# Patient Record
Sex: Male | Born: 1970
Health system: Southern US, Community
[De-identification: ages and names within clinical notes are randomized; demographics above are authoritative.]

## PROBLEM LIST (undated history)

## (undated) DIAGNOSIS — J45909 Unspecified asthma, uncomplicated: Secondary | ICD-10-CM

## (undated) DIAGNOSIS — E785 Hyperlipidemia, unspecified: Secondary | ICD-10-CM

## (undated) HISTORY — DX: Hyperlipidemia, unspecified: E78.5

## (undated) HISTORY — DX: Unspecified asthma, uncomplicated: J45.909

---

## 2005-08-29 ENCOUNTER — Encounter: Admission: RE | Admit: 2005-08-29 | Discharge: 2005-08-29 | Payer: Self-pay | Admitting: Occupational Medicine

## 2013-06-29 ENCOUNTER — Ambulatory Visit: Payer: Self-pay

## 2013-06-29 ENCOUNTER — Ambulatory Visit: Payer: Self-pay | Admitting: Family Medicine

## 2013-06-29 VITALS — BP 98/62 | HR 91 | Temp 98.5°F | Resp 18 | Ht 71.0 in | Wt 172.0 lb

## 2013-06-29 DIAGNOSIS — R05 Cough: Secondary | ICD-10-CM

## 2013-06-29 DIAGNOSIS — R059 Cough, unspecified: Secondary | ICD-10-CM

## 2013-06-29 DIAGNOSIS — R918 Other nonspecific abnormal finding of lung field: Secondary | ICD-10-CM

## 2013-06-29 MED ORDER — PREDNISONE 20 MG PO TABS: ORAL_TABLET | ORAL | Status: DC

## 2013-06-29 MED ORDER — AZITHROMYCIN 250 MG PO TABS: ORAL_TABLET | ORAL | Status: DC

## 2013-06-29 NOTE — Progress Notes (Signed)
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  He has right lower lateral rib pains which began spontaneously 1 month ago.  The pain resolved for 2 weeks but has come back.  No significant activities or trauma hx.  Objective: Well-developed, muscular athletic-appearing young man. HEENT: Unremarkable Chest: Sonorous rhonchi on right, otherwise clear; nontender Heart: Regular no murmur Abdomen: Soft nontender without [facility] reading (PRIMARY) by  Dr. Milus Glazier  CXR  Right perihilar infiltrate  Assessment: Cough - Plan: DG Chest 2 View, azithromycin (ZITHROMAX Z-PAK) 250 MG tablet, predniSONE (DELTASONE) 20 MG tablet  Pulmonary infiltrate - Plan: azithromycin (ZITHROMAX Z-PAK) 250 MG tablet, predniSONE (DELTASONE) 20 MG tablet  Signed, Elvina Sidle, MD  .

## 2014-12-18 ENCOUNTER — Ambulatory Visit: Payer: Self-pay | Admitting: Family Medicine

## 2015-03-12 ENCOUNTER — Ambulatory Visit: Payer: Self-pay | Admitting: Family Medicine

## 2015-03-16 ENCOUNTER — Ambulatory Visit (INDEPENDENT_AMBULATORY_CARE_PROVIDER_SITE_OTHER): Payer: Self-pay | Admitting: Family Medicine

## 2015-03-16 ENCOUNTER — Encounter: Payer: Self-pay | Admitting: Family Medicine

## 2015-03-16 VITALS — BP 122/80 | HR 95 | Temp 98.2°F | Resp 16 | Wt 177.0 lb

## 2015-03-16 DIAGNOSIS — R0982 Postnasal drip: Secondary | ICD-10-CM | POA: Insufficient documentation

## 2015-03-16 DIAGNOSIS — K219 Gastro-esophageal reflux disease without esophagitis: Secondary | ICD-10-CM

## 2015-03-16 NOTE — Progress Notes (Signed)
   Subjective:    Patient ID: Carlos Wade, male    DOB: 04/02/1971, 44 y.o.   MRN: [id number]  HPI Cough- 'i have this lump in my throat'.  Noticed ~1 yr ago.  Recently has been 'coughing up phlegm', having to clear his throat, worse at night.  Pt reports feeling a lump on throat.  pt reports intermittent heartburn. + PND.  No night sweats/fevers.  No unexplained weight loss.  + tobacco use.  No SOB.   Review of Systems For ROS see HPI     Objective:   Physical Exam  Constitutional: He appears well-developed and well-nourished. No distress.  HENT:  Head: Normocephalic and atraumatic.  No TTP over sinuses + turbinate edema + PND TMs normal bilaterally  Eyes: Conjunctivae and EOM are normal. Pupils are equal, round, and reactive to light.  Neck: Normal range of motion. Neck supple. No tracheal deviation present. No thyromegaly present.  Cardiovascular: Normal rate, regular rhythm and normal heart sounds.   Pulmonary/Chest: Effort normal and breath sounds normal. No respiratory distress. He has no wheezes.  Abdominal: Soft. Bowel sounds are normal. He exhibits no distension. There is no tenderness. There is no rebound.  Lymphadenopathy:    He has no cervical adenopathy.  Skin: Skin is warm and dry.  Psychiatric: He has a normal mood and affect. His behavior is normal. Thought content normal.  Vitals reviewed.         Assessment & Plan:

## 2015-03-16 NOTE — Patient Instructions (Signed)
Follow up as scheduled- sooner if needed Start daily Zyrtec (store brand generic) for the seasonal allergies/post-nasal drip Start daily Omeprazole 20mg  for the acid reflux component Try and avoid eating 2 hrs prior to bed Prop up your head on pillows Drink plenty of fluids Limit smoking, caffeine, and alcohol- all can worsen reflux Call with any questions or concerns Welcome!  We're glad to have you! Hang in there!

## 2015-03-16 NOTE — Progress Notes (Signed)
Pre visit review using our clinic review tool, if applicable. No additional management support is needed unless otherwise documented below in the visit note. 

## 2015-03-18 NOTE — Assessment & Plan Note (Signed)
Pt also w/ PND on PE and this is likely contributing to cough, 'lump in throat' and needing to clear his throat regularly.  Start OTC antihistamine.  Reviewed supportive care and red flags that should prompt return.  Pt expressed understanding and is in agreement w/ plan.

## 2015-03-18 NOTE — Assessment & Plan Note (Signed)
New.  Pt's sxs are consistent w/ untreated GERD.  Start PPI to reduce nocturnal reflux.  Reviewed dietary and lifestyle modifications.  Will continue to follow.

## 2015-03-30 ENCOUNTER — Ambulatory Visit: Payer: Self-pay | Admitting: Family Medicine

## 2015-07-11 ENCOUNTER — Ambulatory Visit (INDEPENDENT_AMBULATORY_CARE_PROVIDER_SITE_OTHER): Payer: BLUE CROSS/BLUE SHIELD | Admitting: Family Medicine

## 2015-07-11 ENCOUNTER — Encounter: Payer: Self-pay | Admitting: Family Medicine

## 2015-07-11 VITALS — BP 112/78 | HR 96 | Temp 98.2°F | Resp 17 | Ht 71.0 in | Wt 170.0 lb

## 2015-07-11 DIAGNOSIS — Z0000 Encounter for general adult medical examination without abnormal findings: Secondary | ICD-10-CM | POA: Diagnosis not present

## 2015-07-11 MED ORDER — CETIRIZINE HCL 10 MG PO TABS: 10.0000 mg | ORAL_TABLET | Freq: Every day | ORAL | Status: DC

## 2015-07-11 MED ORDER — OMEPRAZOLE 20 MG PO CPDR: 20.0000 mg | DELAYED_RELEASE_CAPSULE | Freq: Every day | ORAL | Status: DC

## 2015-07-11 NOTE — Progress Notes (Signed)
   Subjective:    Patient ID: Carlos Wade, male    DOB: [birth year], 44 y.o.   MRN: [id number]  HPI CPE- no concerns today   Review of Systems Patient reports no vision/hearing changes, anorexia, fever ,adenopathy, persistant/recurrent hoarseness, swallowing issues, chest pain, palpitations, edema, hemoptysis, dyspnea (rest,exertional, paroxysmal nocturnal), gastrointestinal  bleeding (melena, rectal bleeding), abdominal pain, GU symptoms (dysuria, hematuria, voiding/incontinence issues) syncope, focal weakness, memory loss, numbness & tingling, skin/hair/nail changes, depression, anxiety, abnormal bruising/bleeding, musculoskeletal symptoms/signs.   + cough- improved while taking Zyrtec and PPI.  Returned after stopping meds. + GERD- not taking the Omeprazole as directed    Objective:   Physical Exam General Appearance:    Alert, cooperative, no distress, appears stated age  Head:    Normocephalic, without obvious abnormality, atraumatic  Eyes:    PERRL, conjunctiva/corneas clear, EOM's intact, fundi    benign, both eyes       Ears:    Normal TM's and external ear canals, both ears  Nose:   Nares normal, septum midline, mucosa normal, no drainage   or sinus tenderness  Throat:   Lips, mucosa, and tongue normal; teeth and gums normal  Neck:   Supple, symmetrical, trachea midline, no adenopathy;       thyroid:  No enlargement/tenderness/nodules  Back:     Symmetric, no curvature, ROM normal, no CVA tenderness  Lungs:     Clear to auscultation bilaterally, respirations unlabored  Chest wall:    No tenderness or deformity  Heart:    Regular rate and rhythm, S1 and S2 normal, no murmur, rub   or gallop  Abdomen:     Soft, non-tender, bowel sounds active all four quadrants,    no masses, no organomegaly  Genitalia:    Normal male without lesion, masses,discharge or tenderness  Rectal:    Deferred due to young age  Extremities:   Extremities normal, atraumatic, no cyanosis or edema    Pulses:   2+ and symmetric all extremities  Skin:   Skin color, texture, turgor normal, no rashes or lesions  Lymph nodes:   Cervical, supraclavicular, and axillary nodes normal  Neurologic:   CNII-XII intact. Normal strength, sensation and reflexes      throughout          Assessment & Plan:

## 2015-07-11 NOTE — Progress Notes (Signed)
Pre visit review using our clinic review tool, if applicable. No additional management support is needed unless otherwise documented below in the visit note. 

## 2015-07-11 NOTE — Assessment & Plan Note (Signed)
Pt's PE WNL.  Encouraged smoking cessation.  Check labs.  Anticipatory guidance provided.

## 2015-07-11 NOTE — Patient Instructions (Signed)
Follow up in 1 year or as needed We'll notify you of your lab results and make any changes if needed Start the Omeprazole daily for acid reflux Start Zyrtec daily for the post-nasal drip STOP SMOKING! Call with any questions or concerns Happy Labor Day!

## 2015-07-12 ENCOUNTER — Telehealth: Payer: Self-pay

## 2015-07-12 ENCOUNTER — Other Ambulatory Visit (INDEPENDENT_AMBULATORY_CARE_PROVIDER_SITE_OTHER): Payer: BLUE CROSS/BLUE SHIELD

## 2015-07-12 DIAGNOSIS — R739 Hyperglycemia, unspecified: Secondary | ICD-10-CM | POA: Diagnosis not present

## 2015-07-12 LAB — HEPATIC FUNCTION PANEL
ALT: 62 U/L — ABNORMAL HIGH (ref 0–53)
AST: 39 U/L — ABNORMAL HIGH (ref 0–37)
Albumin: 4.4 g/dL (ref 3.5–5.2)
Alkaline Phosphatase: 38 U/L — ABNORMAL LOW (ref 39–117)
Bilirubin, Direct: 0.1 mg/dL (ref 0.0–0.3)
Total Bilirubin: 0.4 mg/dL (ref 0.2–1.2)
Total Protein: 7.5 g/dL (ref 6.0–8.3)

## 2015-07-12 LAB — CBC WITH DIFFERENTIAL/PLATELET
Basophils Absolute: 0 10*3/uL (ref 0.0–0.1)
Basophils Relative: 0.6 % (ref 0.0–3.0)
Eosinophils Absolute: 0.1 10*3/uL (ref 0.0–0.7)
Eosinophils Relative: 2.6 % (ref 0.0–5.0)
HCT: 40.5 % (ref 39.0–52.0)
Hemoglobin: 13.3 g/dL (ref 13.0–17.0)
Lymphocytes Relative: 27 % (ref 12.0–46.0)
Lymphs Abs: 1.1 10*3/uL (ref 0.7–4.0)
MCHC: 32.8 g/dL (ref 30.0–36.0)
MCV: 96 fl (ref 78.0–100.0)
MONOS PCT: 7.6 % (ref 3.0–12.0)
Monocytes Absolute: 0.3 10*3/uL (ref 0.1–1.0)
Neutro Abs: 2.6 10*3/uL (ref 1.4–7.7)
Neutrophils Relative %: 62.2 % (ref 43.0–77.0)
Platelets: 218 10*3/uL (ref 150.0–400.0)
RBC: 4.22 Mil/uL (ref 4.22–5.81)
RDW: 14.6 % (ref 11.5–15.5)
WBC: 4.2 10*3/uL (ref 4.0–10.5)

## 2015-07-12 LAB — LIPID PANEL
Cholesterol: 207 mg/dL — ABNORMAL HIGH (ref 0–200)
HDL: 87.9 mg/dL (ref >39.00)
LDL Cholesterol: 107 mg/dL — ABNORMAL HIGH (ref 0–99)
NonHDL: 119.25
Total CHOL/HDL Ratio: 2
Triglycerides: 62 mg/dL (ref 0.0–149.0)
VLDL: 12.4 mg/dL (ref 0.0–40.0)

## 2015-07-12 LAB — BASIC METABOLIC PANEL
BUN: 8 mg/dL (ref 6–23)
CO2: 25 mEq/L (ref 19–32)
Calcium: 9.6 mg/dL (ref 8.4–10.5)
Chloride: 106 mEq/L (ref 96–112)
Creatinine, Ser: 1.06 mg/dL (ref 0.40–1.50)
GFR: 96.45 mL/min (ref >60.00)
GLUCOSE: 118 mg/dL — AB (ref 70–99)
Potassium: 3.9 mEq/L (ref 3.5–5.1)
Sodium: 143 mEq/L (ref 135–145)

## 2015-07-12 LAB — HEMOGLOBIN A1C: Hgb A1c MFr Bld: 5.3 % (ref 4.6–6.5)

## 2015-07-12 LAB — PSA: PSA: 1.57 ng/mL (ref 0.10–4.00)

## 2015-07-12 LAB — TSH: TSH: 0.6 u[IU]/mL (ref 0.35–4.50)

## 2015-07-12 NOTE — Telephone Encounter (Signed)
-----   Message from Carlos Hatch, MD sent at 07/12/2015  2:46 PM EDT ----- Sugar is elevated- suspect that pt wasn't fasting but please add A1C to r/o diabetes Liver functions are mildly elevated.  Please hold tylenol and alcohol and repeat LFTs at lab only visit in 2 weeks. Remainder of labs look good

## 2015-07-12 NOTE — Telephone Encounter (Signed)
LMOVM

## 2015-07-13 NOTE — Telephone Encounter (Signed)
-----   Message from Sheliah Hatch, MD sent at 07/12/2015  4:52 PM EDT ----- No evidence of diabetes

## 2015-07-13 NOTE — Telephone Encounter (Signed)
Will send letter with results.

## 2015-07-20 ENCOUNTER — Other Ambulatory Visit: Payer: Self-pay | Admitting: Family Medicine

## 2015-07-20 ENCOUNTER — Encounter: Payer: Self-pay | Admitting: General Practice

## 2015-07-20 DIAGNOSIS — R945 Abnormal results of liver function studies: Principal | ICD-10-CM

## 2015-07-20 DIAGNOSIS — R7989 Other specified abnormal findings of blood chemistry: Secondary | ICD-10-CM

## 2015-07-24 ENCOUNTER — Other Ambulatory Visit: Payer: Self-pay | Admitting: Family Medicine

## 2015-07-24 ENCOUNTER — Telehealth: Payer: Self-pay | Admitting: Family Medicine

## 2015-07-24 DIAGNOSIS — R7989 Other specified abnormal findings of blood chemistry: Secondary | ICD-10-CM

## 2015-07-24 DIAGNOSIS — R945 Abnormal results of liver function studies: Secondary | ICD-10-CM

## 2015-07-24 NOTE — Telephone Encounter (Signed)
Caller name: Makai Dumond   Relationship to patient: Self  Can be reached: [phone] Pharmacy:  Reason for call: pt is returning your call.

## 2015-07-24 NOTE — Telephone Encounter (Signed)
Pt notified of lab results, scheduled, and labs ordered.

## 2015-08-08 ENCOUNTER — Other Ambulatory Visit (INDEPENDENT_AMBULATORY_CARE_PROVIDER_SITE_OTHER): Payer: BLUE CROSS/BLUE SHIELD

## 2015-08-08 ENCOUNTER — Other Ambulatory Visit: Payer: Self-pay | Admitting: Family Medicine

## 2015-08-08 DIAGNOSIS — R945 Abnormal results of liver function studies: Secondary | ICD-10-CM

## 2015-08-08 DIAGNOSIS — R7989 Other specified abnormal findings of blood chemistry: Secondary | ICD-10-CM

## 2015-08-08 LAB — HEPATIC FUNCTION PANEL
ALBUMIN: 4.7 g/dL (ref 3.5–5.2)
ALT: 81 U/L — ABNORMAL HIGH (ref 0–53)
AST: 61 U/L — AB (ref 0–37)
Alkaline Phosphatase: 39 U/L (ref 39–117)
BILIRUBIN TOTAL: 0.6 mg/dL (ref 0.2–1.2)
Bilirubin, Direct: 0.2 mg/dL (ref 0.0–0.3)
Total Protein: 8 g/dL (ref 6.0–8.3)

## 2015-08-22 ENCOUNTER — Other Ambulatory Visit: Payer: BLUE CROSS/BLUE SHIELD

## 2016-02-10 ENCOUNTER — Ambulatory Visit (INDEPENDENT_AMBULATORY_CARE_PROVIDER_SITE_OTHER): Payer: BLUE CROSS/BLUE SHIELD

## 2016-02-10 ENCOUNTER — Ambulatory Visit (INDEPENDENT_AMBULATORY_CARE_PROVIDER_SITE_OTHER): Payer: BLUE CROSS/BLUE SHIELD | Admitting: Osteopathic Medicine

## 2016-02-10 VITALS — BP 124/80 | HR 100 | Temp 98.2°F | Resp 17 | Ht 71.0 in | Wt 175.0 lb

## 2016-02-10 DIAGNOSIS — S99922A Unspecified injury of left foot, initial encounter: Secondary | ICD-10-CM

## 2016-02-10 DIAGNOSIS — M25572 Pain in left ankle and joints of left foot: Secondary | ICD-10-CM | POA: Diagnosis not present

## 2016-02-10 DIAGNOSIS — S99912A Unspecified injury of left ankle, initial encounter: Secondary | ICD-10-CM

## 2016-02-10 DIAGNOSIS — R740 Nonspecific elevation of levels of transaminase and lactic acid dehydrogenase [LDH]: Secondary | ICD-10-CM | POA: Diagnosis not present

## 2016-02-10 DIAGNOSIS — R7401 Elevation of levels of liver transaminase levels: Secondary | ICD-10-CM | POA: Insufficient documentation

## 2016-02-10 NOTE — Progress Notes (Signed)
HPI: Carlos Wade is a 45 y.o. male who presents to [facility] today for chief complaint of:  Chief Complaint  Patient presents with  . Foot Injury    left side     . Location: L foot . Quality: shooting pain . Severity: severe . Duration: happened 4 days ago . Context: dropped steel plate on foot, shoes did have metatarsal protection however he injured proximal to this . Modifying factors:  . Assoc signs/symptoms: swelling in foot, scraped skin on leg NOTE: PATIENT WAS ASKED IF THIS WAS WORKER'S COMP ISSUE, HE STATES "NO"    Past medical, social and family history reviewed: Past Medical History  Diagnosis Date  . Asthma   . Hyperlipidemia    No past surgical history on file. Social History  Substance Use Topics  . Smoking status: Current Every Day Smoker -- 0.50 packs/day for 25 years    Types: Cigarettes  . Smokeless tobacco: Not on file  . Alcohol Use: 0.0 oz/week    0 Standard drinks or equivalent per week   Family History  Problem Relation Age of Onset  . Asthma Father     No current outpatient prescriptions on file.   No current facility-administered medications for this visit.   No Known Allergies    Review of Systems: CONSTITUTIONAL:  No recent illness CARDIAC: No  chest pain, MUSCULOSKELETAL: (+) L leg/foot myalgia/arthralgia as per HPI SKIN: No  rash/wounds/concerning lesions other than swelling, scraped skin  NEUROLOGIC: No  weakness   Exam:  BP 124/80 mmHg  Pulse 100  Temp(Src) 98.2 F (36.8 C) (Oral)  Resp 17  Ht  (1.803 m)  Wt 175 lb (79.379 kg)  BMI 24.42 kg/m2  SpO2 97% Constitutional: VS see above. General Appearance: alert, well-developed, well-nourished, NAD Ears, Nose, Mouth, Throat: MMM, Normal external inspection ears/nares/mouth/lips Neck: No masses, trachea midline. Respiratory: Normal respiratory effort.  Cardiovascular: No lower extremity edema on R leg, pulses difficult to palpate on  L DP and PT due to edema/swelling, extremity is warm Musculoskeletal: Gait antalgic. No clubbing/cyanosis of digits. Able to move diits of L foot, able to plantar and dorsiflex, (+) edema/swelling LLE at ankle/foot, (+)skin abrasion on tibial aspect of L leg, Squeeze test neg for high ankle sprain, Homan's negative Neurological: Motor and sensation intact and symmetric in legs, limited ROM to dorsi/plantarfelxion due to pain Skin: warm, dry, intact except abrasions noted above. Psychiatric: Normal judgment/insight. Normal mood and affect.    Reviewed labs from 08/08/2015, elev liver enz  IMAGING:  Imaging personally reviewed, I don't see any fracture or dislocation, see below for radiologist over-read, these results were reviewed with the patient.   Dg Ankle Complete Right  02/10/2016  CLINICAL DATA:  Ankle pain after dropping a heavy object on foot, injury to LEFT ankle and foot, swelling, smoker EXAM: RIGHT ANKLE - COMPLETE 3+ VIEW COMPARISON:  None FINDINGS: Extensive soft tissue swelling especially at lateral ankle and extending into foot. Osseous mineralization normal. Joint spaces preserved. No acute fracture, dislocation or bone destruction. IMPRESSION: No acute osseous abnormalities. Electronically Signed   By: Ulyses Southward M.D.   On: 02/10/2016 13:59   Dg Foot Complete Left  02/10/2016  CLINICAL DATA:  Acute left foot pain following heavy object dropped on foot. Initial encounter. EXAM: LEFT FOOT - COMPLETE 3+ VIEW COMPARISON:  None. FINDINGS: There is no evidence of fracture or dislocation. There is no evidence of arthropathy or other focal bone abnormality. Soft  tissues are unremarkable. IMPRESSION: Negative. Electronically Signed   By: Harmon PierJeffrey  Hu M.D.   On: 02/10/2016 14:00      ASSESSMENT/PLAN:  X-rays negative for fracture, however advised patient that if pain continues or worsens, may require MRI and/or sports medicine or orthopedics referral for further evaluation. He can also  follow-up with his primary care physician about this issue as well as abnormal labs as noted above. She states he thinks he is due for a visit with Dr. Beverely Lowabori in the next few weeks anyway. ER/RTC precautions reviewed. Avoid Tylenol until can recheck liver enzymes.   Pain in joint involving ankle and foot, left - Plan: DG Foot Complete Left, DG Ankle Complete Right  Injury of ankle and foot, left, initial encounter - Plan: DG Foot Complete Left, DG Ankle Complete Right  Transaminitis - Elev liver enz 6 mos ago, no followup (Dr Beverely Lowabori)   Visit summary printed and instructions reviewed with the patient. All questions answered. Return if symptoms worsen or fail to improve.

## 2016-02-10 NOTE — Patient Instructions (Addendum)
  If pain worsens, or any other concerns, please let us know or follow up with your primary doctor.  Keep leg elevated.  OK to use Ibuprofen, ACE wraps, ice.    IF you received an x-ray today, you will receive an invoice from [facility]. Please contact [facility] at [phone] with questions or concerns regarding your invoice.   IF you received labwork today, you will receive an invoice from [facility]. Please contact [facility] at [phone] with questions or concerns regarding your invoice.   Our billing staff will not be able to assist you with questions regarding bills from these companies.  You will be contacted with the lab results as soon as they are available. The fastest way to get your results is to activate your My Chart account. Instructions are located on the last page of this paperwork. If you have not heard from us regarding the results in 2 weeks, please contact this office.

## 2016-07-10 ENCOUNTER — Encounter: Payer: BLUE CROSS/BLUE SHIELD | Admitting: Family Medicine

## 2017-04-07 DIAGNOSIS — H6506 Acute serous otitis media, recurrent, bilateral: Secondary | ICD-10-CM | POA: Insufficient documentation

## 2017-11-26 IMAGING — CR DG ANKLE COMPLETE 3+V*R*
4 series · 4 of 4 positions shown · non-contrast
Comparison: None

CLINICAL DATA: Ankle pain after dropping a heavy object on foot,
injury to LEFT ankle and foot, swelling, smoker

EXAM:
RIGHT ANKLE - COMPLETE 3+ VIEW

[AP]
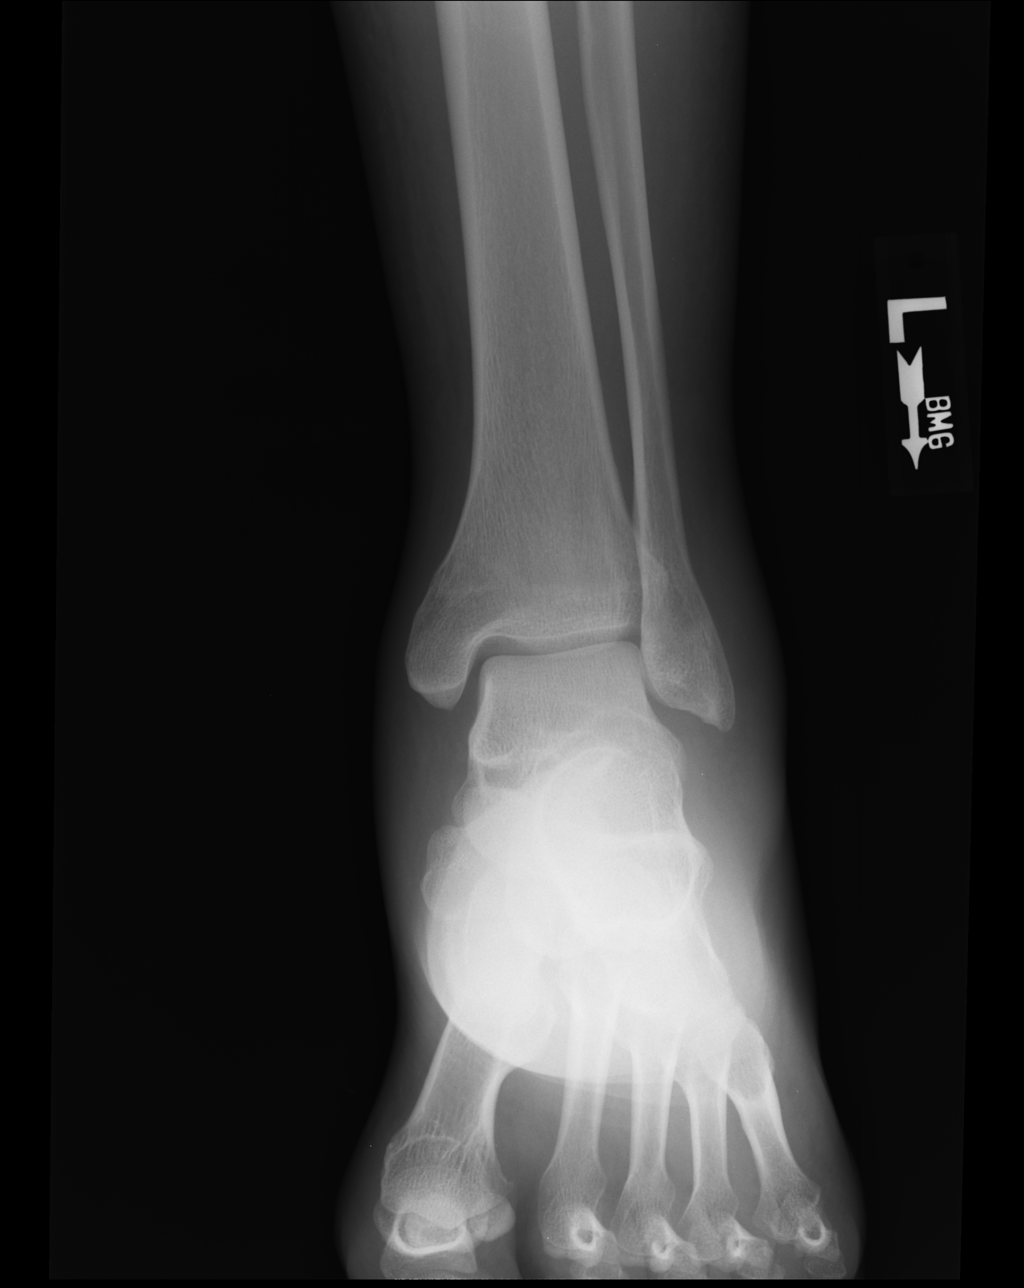

[ap obl int rot]
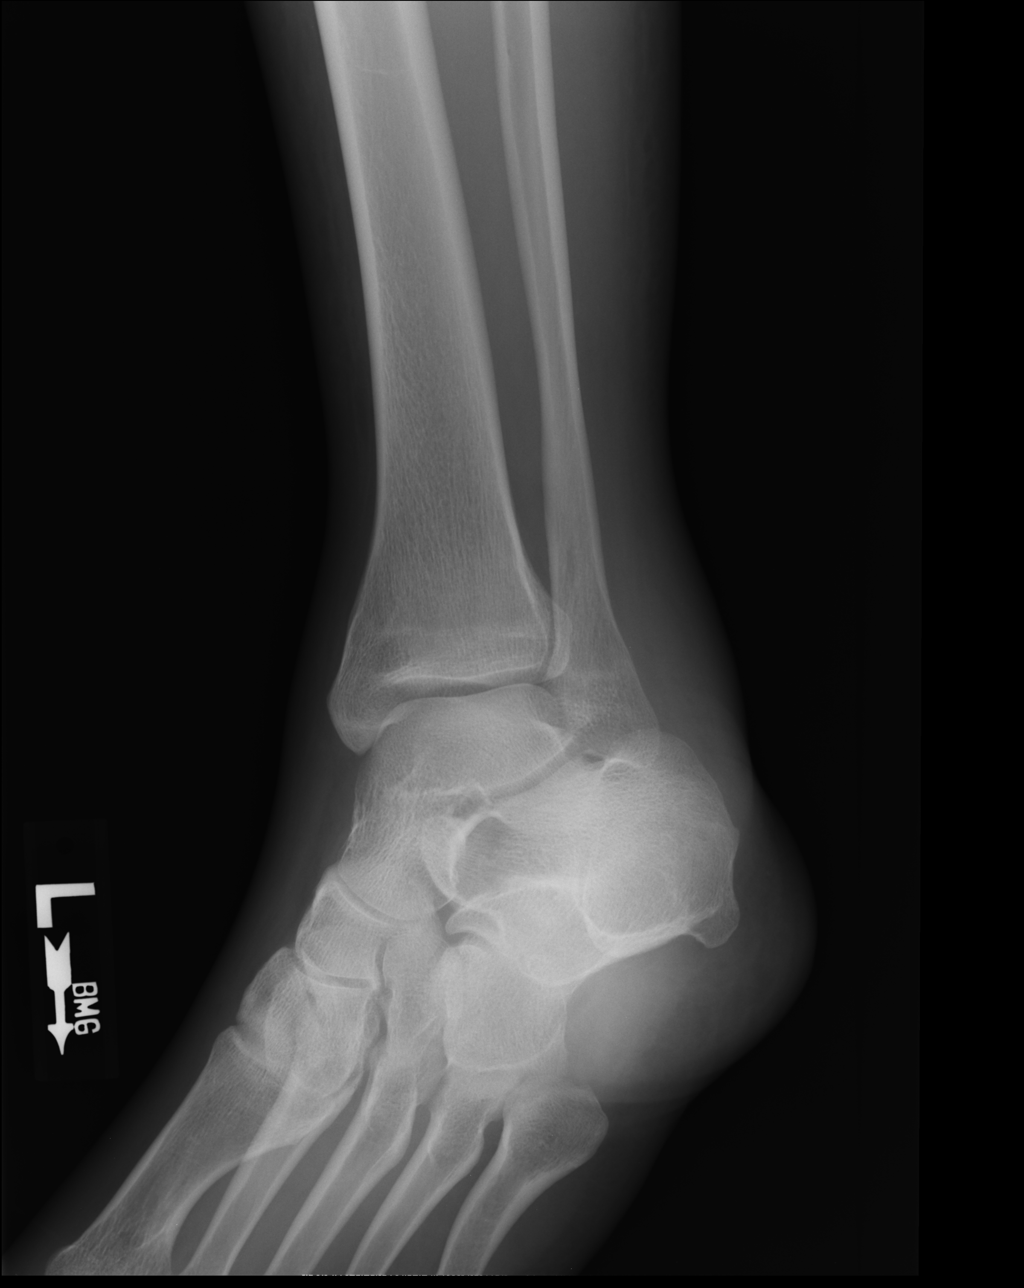

[medial obl]
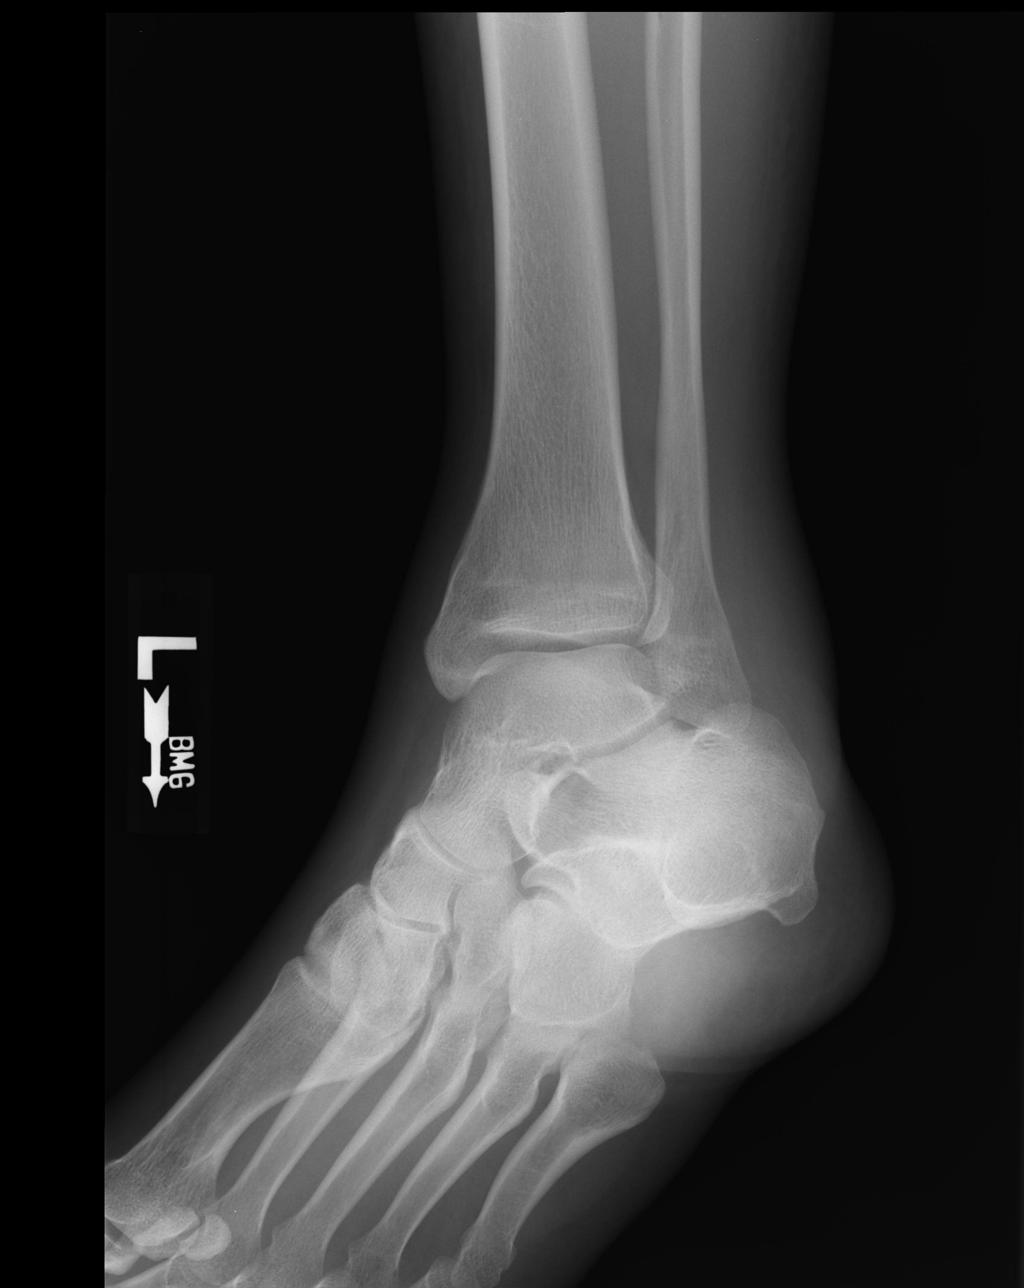

[lateral]
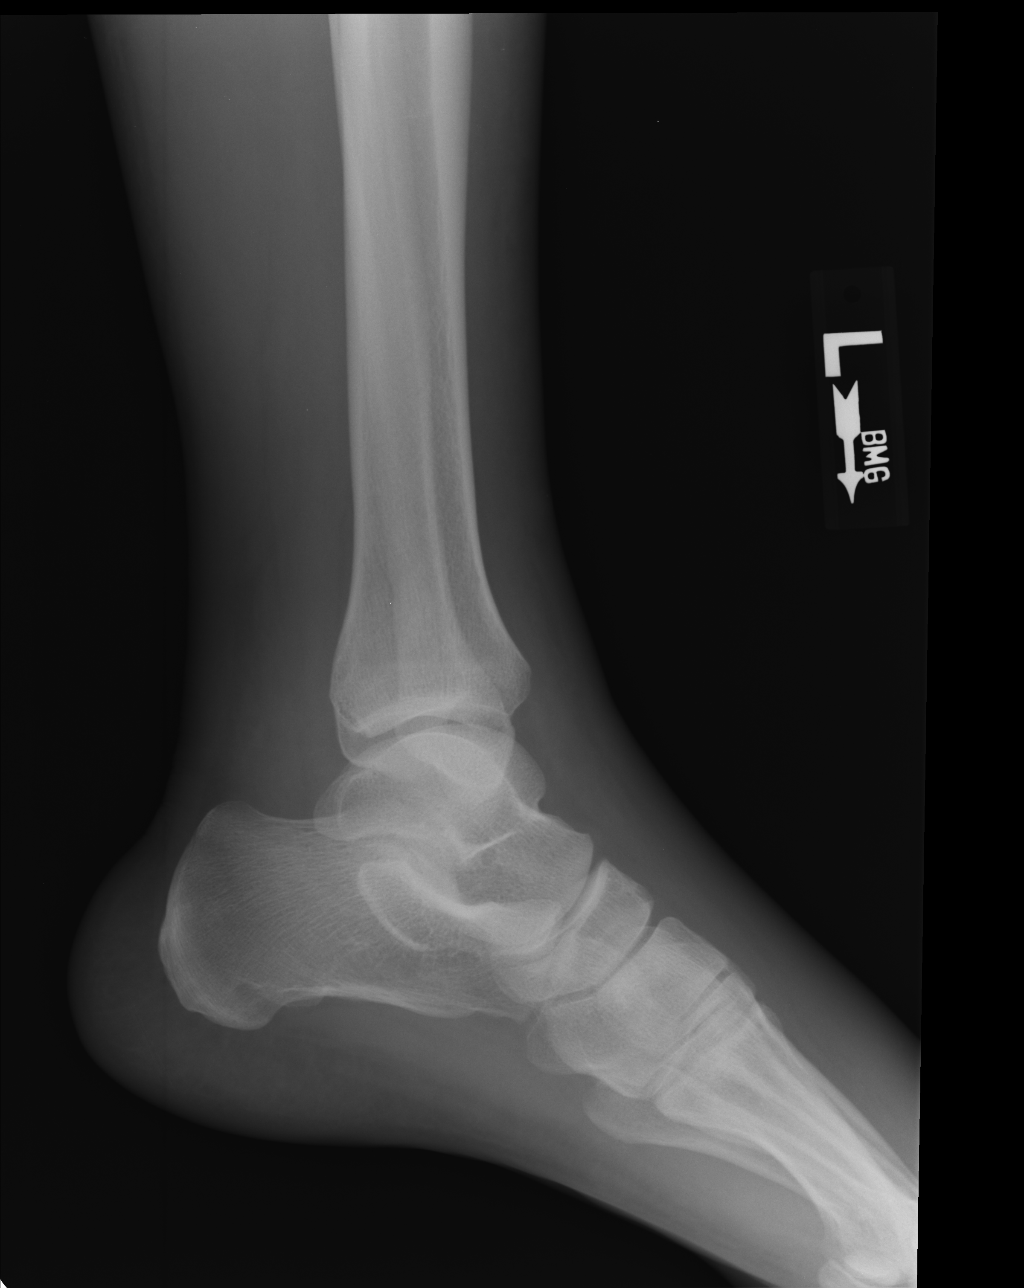

[4 of 4 positions shown; findings below may reference images not displayed]

FINDINGS: Extensive soft tissue swelling especially at lateral ankle and
extending into foot.

Osseous mineralization normal.

Joint spaces preserved.

No acute fracture, dislocation or bone destruction.
IMPRESSION: No acute osseous abnormalities.

## 2017-11-26 IMAGING — CR DG FOOT COMPLETE 3+V*L*
3 series · 3 of 3 positions shown · non-contrast
Comparison: None.

CLINICAL DATA: Acute left foot pain following heavy object dropped
on foot. Initial encounter.

EXAM:
LEFT FOOT - COMPLETE 3+ VIEW

[AP]
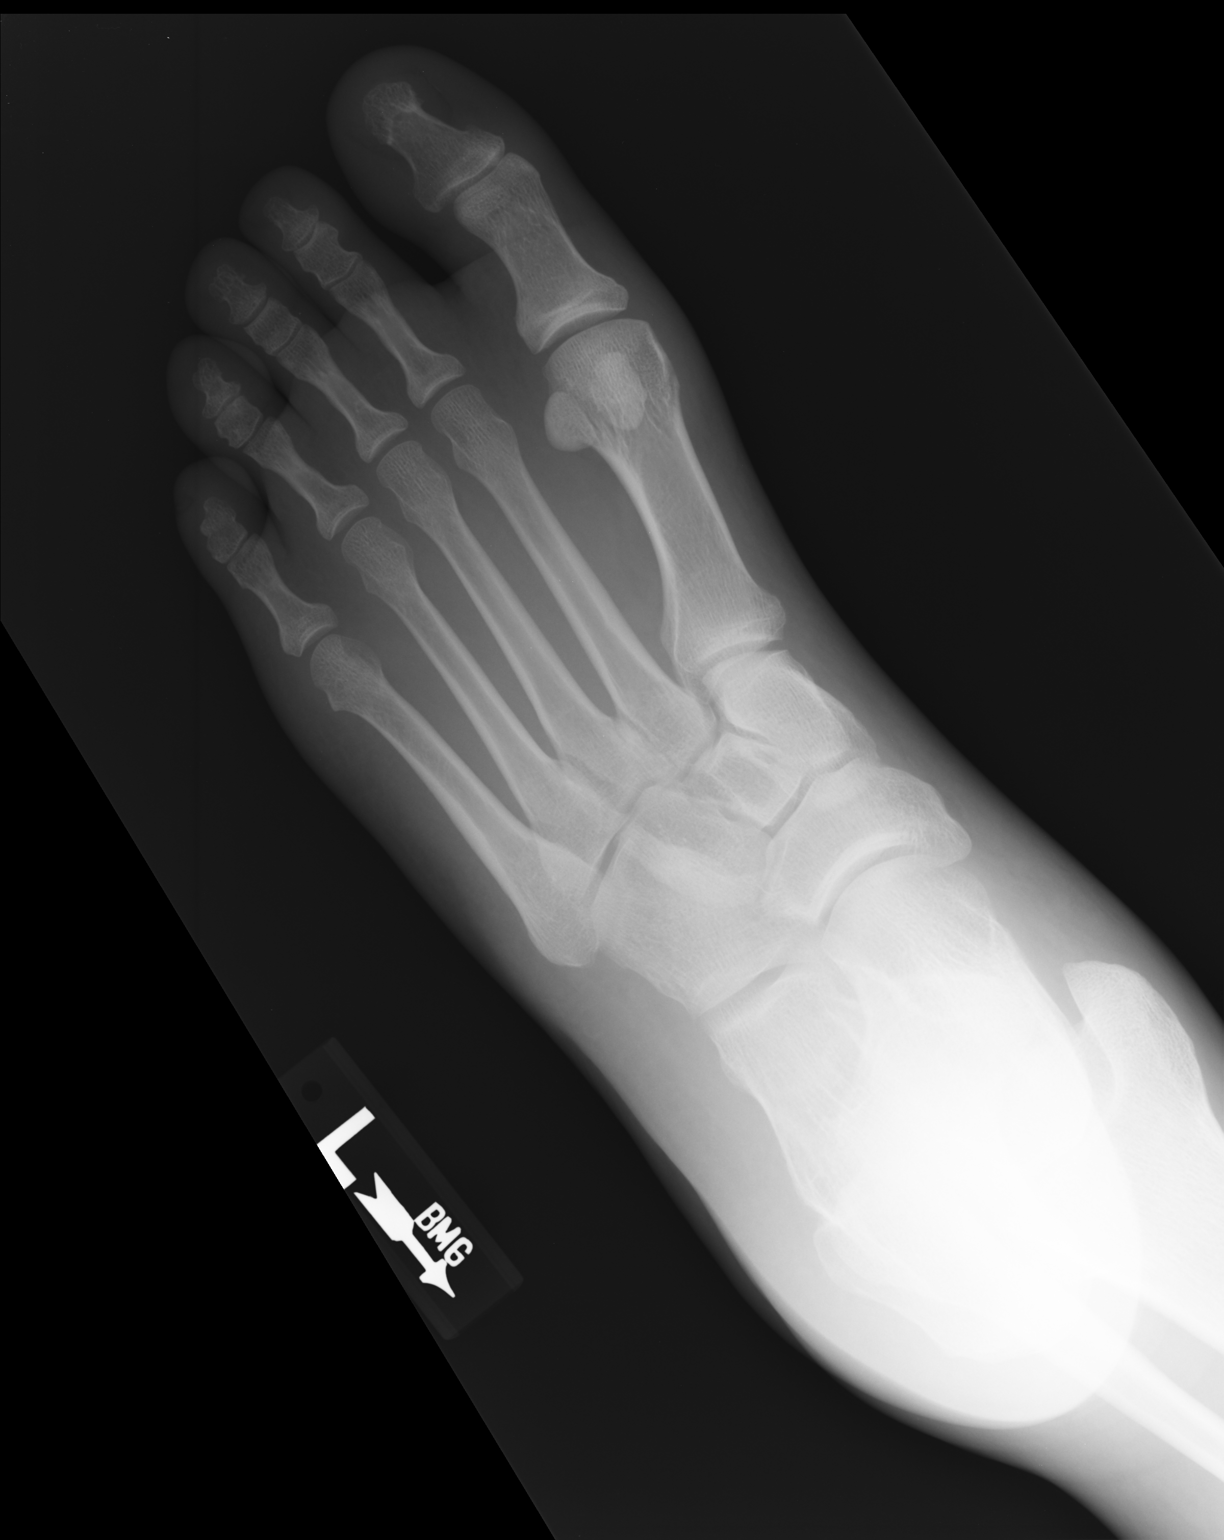

[ap obl int rot]
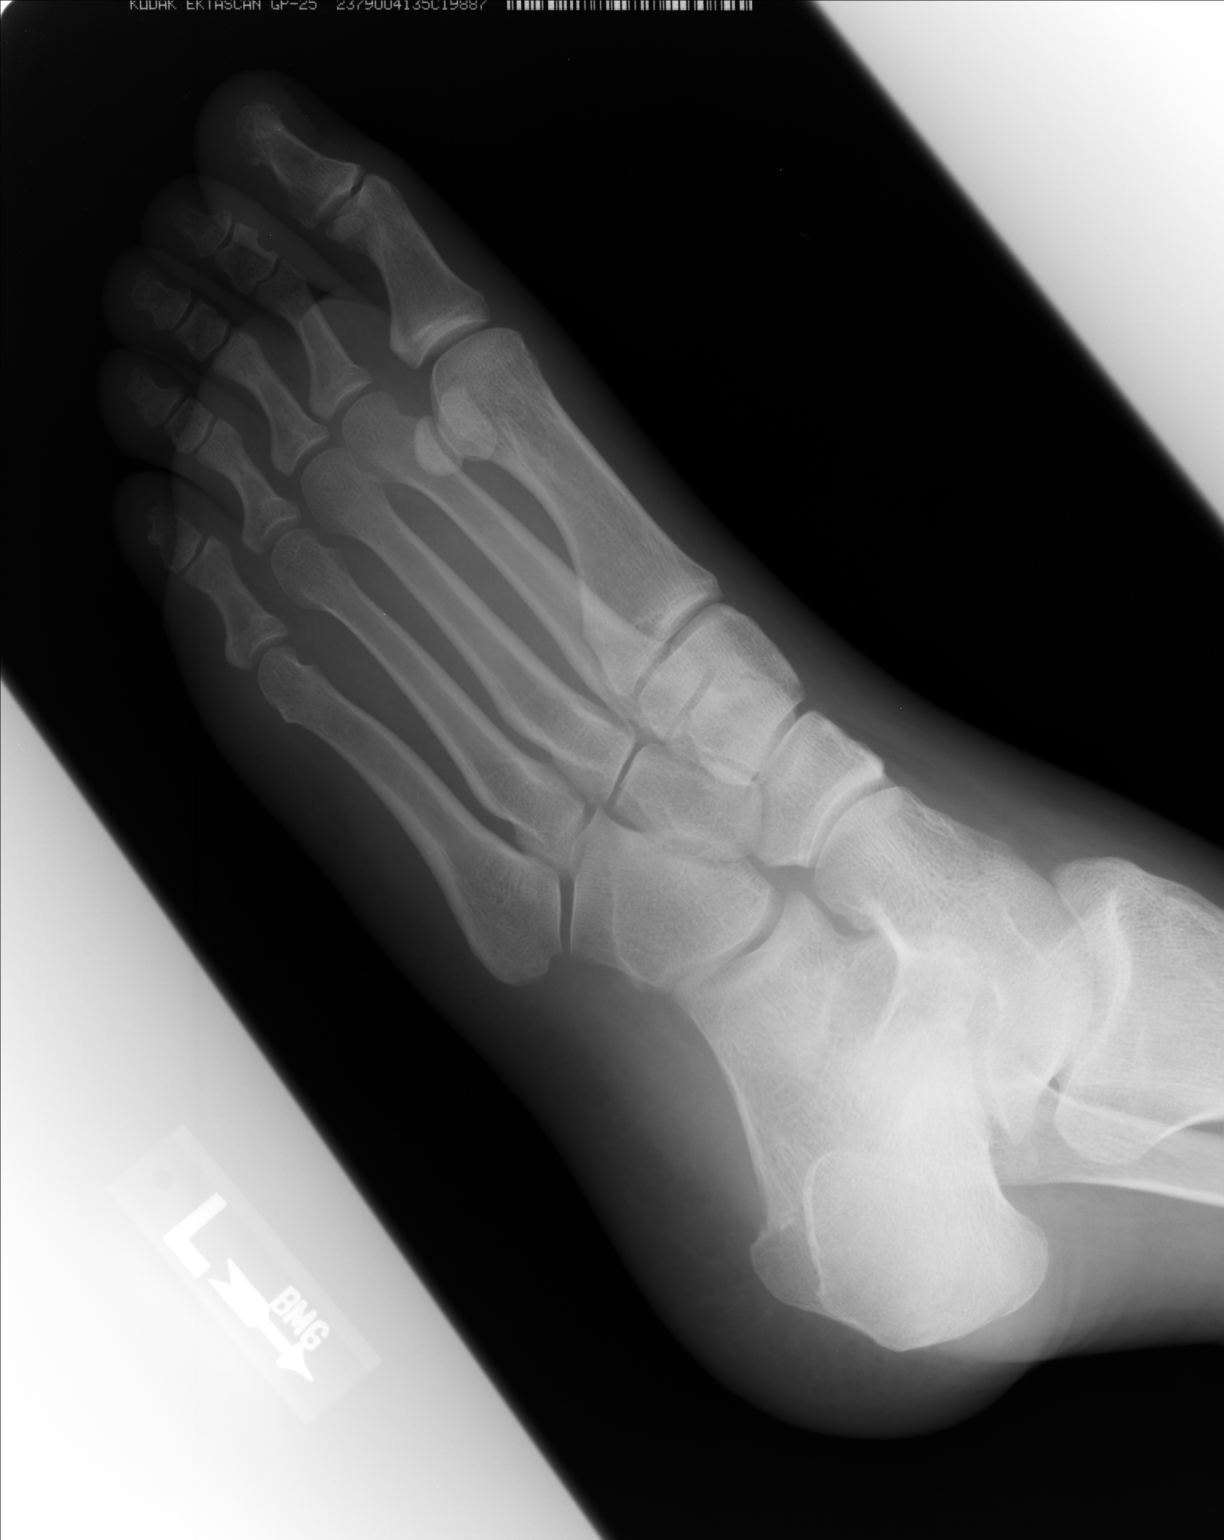

[lateral]
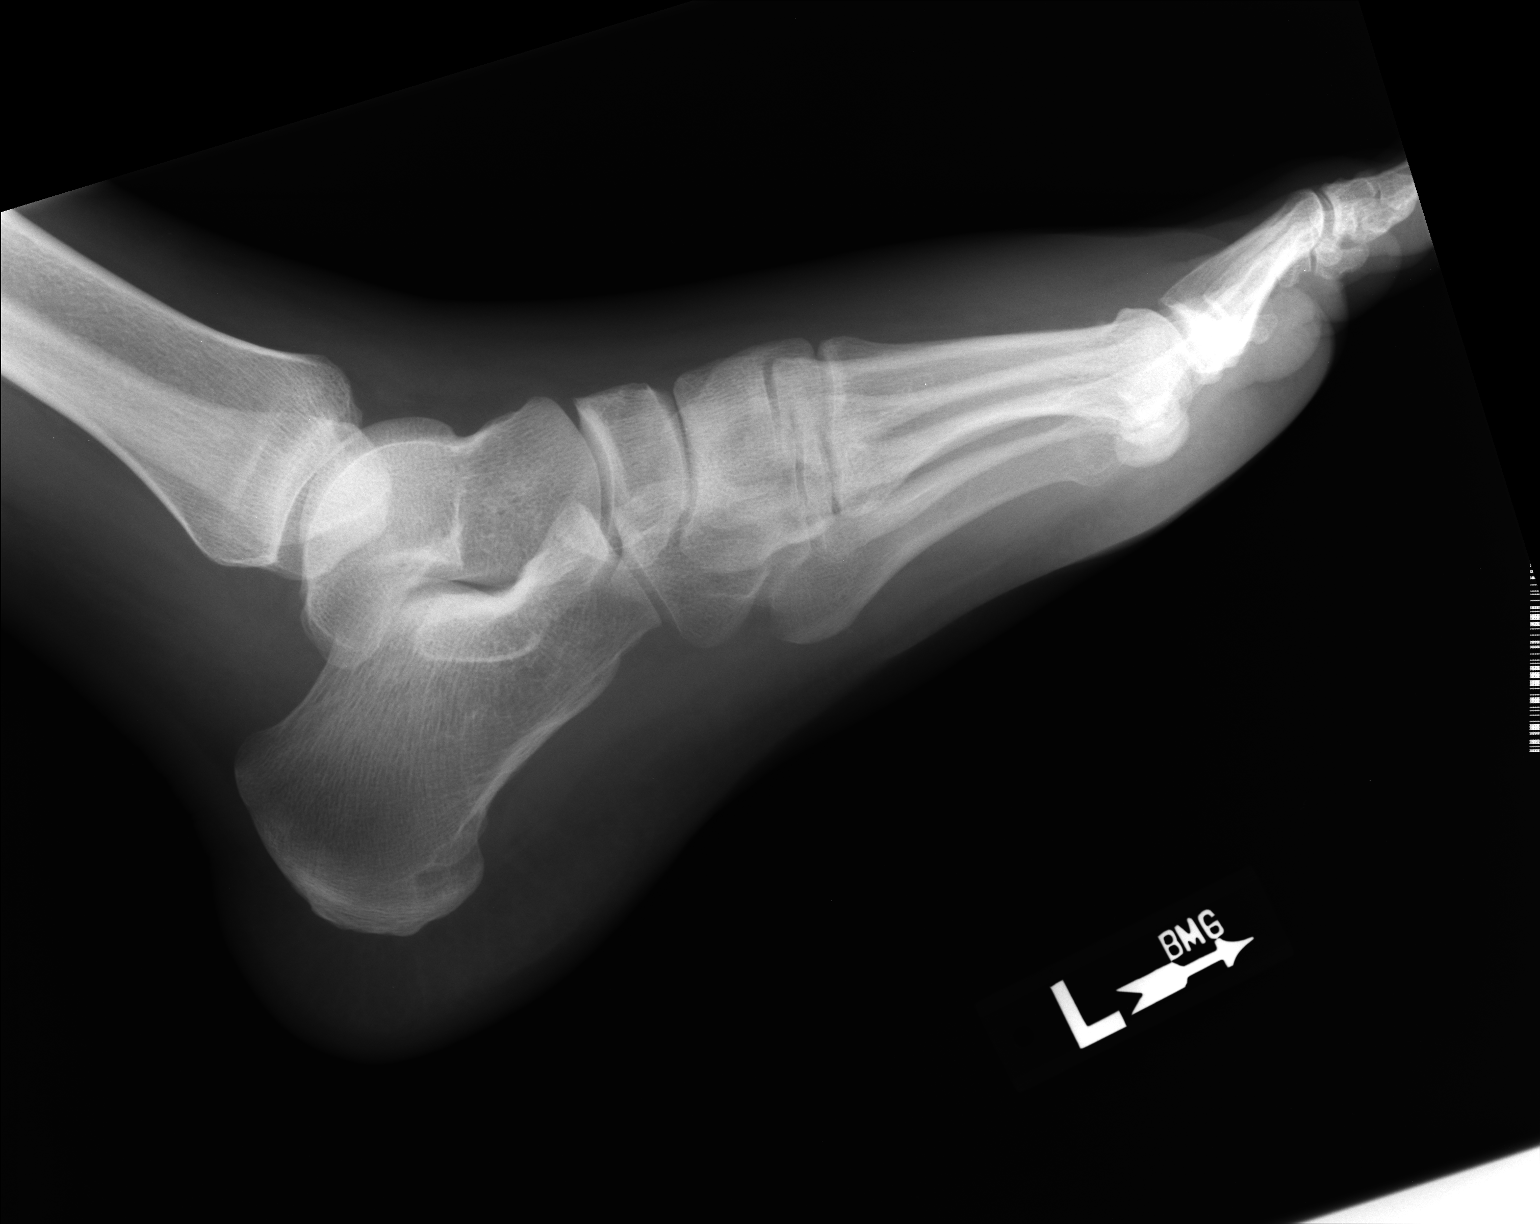

[3 of 3 positions shown; findings below may reference images not displayed]

FINDINGS: There is no evidence of fracture or dislocation. There is no
evidence of arthropathy or other focal bone abnormality. Soft
tissues are unremarkable.
IMPRESSION: Negative.

## 2018-02-01 ENCOUNTER — Telehealth: Payer: Self-pay | Admitting: *Deleted

## 2018-02-01 NOTE — Telephone Encounter (Signed)
That is ok with me  

## 2018-02-01 NOTE — Telephone Encounter (Signed)
Ok to switch 

## 2018-02-01 NOTE — Telephone Encounter (Signed)
Copied from CRM [id number]. Topic: Appointment Scheduling - Scheduling Inquiry for Clinic >> Feb 01, 2018  2:57 PM Oneal GroutSebastian, Jennifer S wrote: Reason for CRM: Requesting to switch from Dr Beverely Lowabori to Dr Patsy Lageropland, Please advise

## 2018-02-03 ENCOUNTER — Ambulatory Visit: Payer: BLUE CROSS/BLUE SHIELD | Admitting: Family Medicine

## 2018-10-11 ENCOUNTER — Ambulatory Visit (INDEPENDENT_AMBULATORY_CARE_PROVIDER_SITE_OTHER): Payer: Commercial Managed Care - PPO | Admitting: Medical

## 2018-10-11 ENCOUNTER — Encounter: Payer: Self-pay | Admitting: Medical

## 2018-10-11 VITALS — BP 125/78 | HR 98 | Temp 98.2°F | Resp 16 | Ht 73.0 in | Wt 174.6 lb

## 2018-10-11 DIAGNOSIS — F17200 Nicotine dependence, unspecified, uncomplicated: Secondary | ICD-10-CM | POA: Diagnosis not present

## 2018-10-11 DIAGNOSIS — R05 Cough: Secondary | ICD-10-CM | POA: Diagnosis not present

## 2018-10-11 DIAGNOSIS — Z8709 Personal history of other diseases of the respiratory system: Secondary | ICD-10-CM

## 2018-10-11 DIAGNOSIS — F1010 Alcohol abuse, uncomplicated: Secondary | ICD-10-CM

## 2018-10-11 DIAGNOSIS — E785 Hyperlipidemia, unspecified: Secondary | ICD-10-CM

## 2018-10-11 DIAGNOSIS — R739 Hyperglycemia, unspecified: Secondary | ICD-10-CM

## 2018-10-11 DIAGNOSIS — R059 Cough, unspecified: Secondary | ICD-10-CM

## 2018-10-11 NOTE — Patient Instructions (Addendum)
Nice to meet you today.  For your history of high cholesterol, excess alcohol use, smoking, recurrent cough and history of elevated blood sugar, I placed future labs include CBC, CMP, lipid panel and A1c.  For history of smoking and cough, you can get a chest x-ray today.  Advised on smoking cessation methods.  Also the benefit that you would get cutting back significantly alcohol use.  Please schedule fasting labs on the way out.  Follow-up date to be determined after chest x-ray and lab review.

## 2018-10-11 NOTE — Progress Notes (Signed)
Subjective:    Patient ID: Carlos Wade, male    DOB: 1970-11-17, 47 y.o.   MRN: [id number]  HPI  Pt in for first time with me. 3 years since seen by Dr. Beverely Low.  Pt is Psychologist, occupational. He is pretty busy at work. He works out 3-4 days a week. Tries to eat healthy. No caffeine. He drinks a out a six pack a day. Married- 2 children.  Hx of asthma- last flare years ago. 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   Hx of high cholesterol as well. He is not fasting.  Smoker- about half a pack a day for 25 years on and off.  Pt like to ride motorcycles. Plays pool and bowels.  Pt states cough intermittently over past year. He states will bring up mucus at times.    Review of Systems  Constitutional: Negative for chills, fatigue and fever.  Respiratory: Positive for cough. Negative for apnea, chest tightness, shortness of breath and wheezing.   Cardiovascular: Negative for chest pain and palpitations.  Gastrointestinal: Negative for abdominal pain.  Musculoskeletal: Negative for back pain and neck pain.  Skin: Negative for rash.  Neurological: Negative for facial asymmetry, speech difficulty, weakness and numbness.  Hematological: Negative for adenopathy. Does not bruise/bleed easily.  Psychiatric/Behavioral: Negative for behavioral problems and confusion. The patient is not nervous/anxious.     Past Medical History:  Diagnosis Date  . Asthma   . Hyperlipidemia      Social History   Socioeconomic History  . Marital status: Unknown    Spouse name: Not on file  . Number of children: Not on file  . Years of education: Not on file  . Highest education level: Not on file  Occupational History  . Not on file  Social Needs  . Financial resource strain: Not on file  . Food insecurity:    Worry: Not on file    Inability: Not on file  . Transportation needs:    Medical: Not on file    Non-medical: Not on file  Tobacco Use  . Smoking status: Current Every  Day Smoker    Packs/day: 0.50    Years: 25.00    Pack years: 12.50    Types: Cigarettes  . Smokeless tobacco: Never Used  Substance and Sexual Activity  . Alcohol use: Yes    Alcohol/week: 0.0 standard drinks    Comment: 6 pack a day for 20 years.  . Drug use: No  . Sexual activity: Yes  Lifestyle  . Physical activity:    Days per week: Not on file    Minutes per session: Not on file  . Stress: Not on file  Relationships  . Social connections:    Talks on phone: Not on file    Gets together: Not on file    Attends religious service: Not on file    Active member of club or organization: Not on file    Attends meetings of clubs or organizations: Not on file    Relationship status: Not on file  . Intimate partner violence:    Fear of current or ex partner: Not on file    Emotionally abused: Not on file    Physically abused: Not on file    Forced sexual activity: Not on file  Other Topics Concern  . Not on file  Social History Narrative  . Not on file    No past surgical history on file.  Family History  Problem Relation Age of Onset  . Asthma Father     No Known Allergies  No current outpatient medications on file prior to visit.   No current facility-administered medications on file prior to visit.     BP 125/78   Pulse 98   Temp 98.2 F (36.8 C) (Oral)   Resp 16   Ht 6\' 1"  (1.854 m)   Wt 174 lb 9.6 oz (79.2 kg)   SpO2 100%   BMI 23.04 kg/m       Objective:   Physical Exam  General Mental Status- Alert. General Appearance- Not in acute distress.   Skin General: Color- Normal Color. Moisture- Normal Moisture.  Neck Carotid Arteries- Normal color. Moisture- Normal Moisture. No carotid bruits. No JVD.  Chest and Lung Exam Auscultation: Breath Sounds:-Normal.  Cardiovascular Auscultation:Rythm- Regular. Murmurs & Other Heart Sounds:Auscultation of the heart reveals- No Murmurs.  Abdomen Inspection:-Inspeection  Normal. Palpation/Percussion:Note:No mass. Palpation and Percussion of the abdomen reveal- Non Tender, Non Distended + BS, no rebound or guarding.   Neurologic Cranial Nerve exam:- CN III-XII intact(No nystagmus), symmetric smile. Strength:- 5/5 equal and symmetric strength both upper and lower extremities.      Assessment & Plan:  Nice to meet you today.  For your history of high cholesterol, excess alcohol use, smoking, recurrent cough and history of elevated blood sugar, I placed future labs include CBC, CMP, lipid panel and A1c.  For history of smoking and cough, you can get a chest x-ray today.  Advised on smoking cessation methods.  Also the benefit that you would get cutting back significantly alcohol use.  Please schedule fasting labs on the way out.  Follow-up date to be determined after chest x-ray and lab review.

## 2018-10-13 ENCOUNTER — Other Ambulatory Visit (INDEPENDENT_AMBULATORY_CARE_PROVIDER_SITE_OTHER): Payer: Commercial Managed Care - PPO

## 2018-10-13 DIAGNOSIS — E785 Hyperlipidemia, unspecified: Secondary | ICD-10-CM | POA: Diagnosis not present

## 2018-10-13 DIAGNOSIS — F1010 Alcohol abuse, uncomplicated: Secondary | ICD-10-CM | POA: Diagnosis not present

## 2018-10-13 DIAGNOSIS — R05 Cough: Secondary | ICD-10-CM

## 2018-10-13 DIAGNOSIS — R739 Hyperglycemia, unspecified: Secondary | ICD-10-CM

## 2018-10-13 DIAGNOSIS — F17200 Nicotine dependence, unspecified, uncomplicated: Secondary | ICD-10-CM

## 2018-10-13 DIAGNOSIS — R059 Cough, unspecified: Secondary | ICD-10-CM

## 2018-10-14 LAB — LIPID PANEL
Cholesterol: 228 mg/dL — ABNORMAL HIGH (ref 0–200)
HDL: 66.7 mg/dL (ref >39.00)
LDL Cholesterol: 143 mg/dL — ABNORMAL HIGH (ref 0–99)
NonHDL: 161.09
Total CHOL/HDL Ratio: 3
Triglycerides: 88 mg/dL (ref 0.0–149.0)
VLDL: 17.6 mg/dL (ref 0.0–40.0)

## 2018-10-14 LAB — CBC WITH DIFFERENTIAL/PLATELET
Basophils Absolute: 0 10*3/uL (ref 0.0–0.1)
Basophils Relative: 0.8 % (ref 0.0–3.0)
Eosinophils Absolute: 0.1 10*3/uL (ref 0.0–0.7)
Eosinophils Relative: 2.7 % (ref 0.0–5.0)
HCT: 38 % — ABNORMAL LOW (ref 39.0–52.0)
HEMOGLOBIN: 12.6 g/dL — AB (ref 13.0–17.0)
Lymphocytes Relative: 35.3 % (ref 12.0–46.0)
Lymphs Abs: 1.2 10*3/uL (ref 0.7–4.0)
MCHC: 33.1 g/dL (ref 30.0–36.0)
MCV: 94 fl (ref 78.0–100.0)
Monocytes Absolute: 0.4 10*3/uL (ref 0.1–1.0)
Monocytes Relative: 12.2 % — ABNORMAL HIGH (ref 3.0–12.0)
Neutro Abs: 1.7 10*3/uL (ref 1.4–7.7)
Neutrophils Relative %: 49 % (ref 43.0–77.0)
Platelets: 235 10*3/uL (ref 150.0–400.0)
RBC: 4.04 Mil/uL — AB (ref 4.22–5.81)
RDW: 13.9 % (ref 11.5–15.5)
WBC: 3.5 10*3/uL — ABNORMAL LOW (ref 4.0–10.5)

## 2018-10-14 LAB — COMPREHENSIVE METABOLIC PANEL
ALT: 12 U/L (ref 0–53)
AST: 17 U/L (ref 0–37)
Albumin: 4.6 g/dL (ref 3.5–5.2)
Alkaline Phosphatase: 39 U/L (ref 39–117)
BUN: 17 mg/dL (ref 6–23)
CO2: 29 mEq/L (ref 19–32)
Calcium: 9.7 mg/dL (ref 8.4–10.5)
Chloride: 102 mEq/L (ref 96–112)
Creatinine, Ser: 1.14 mg/dL (ref 0.40–1.50)
GFR: 87.48 mL/min (ref >60.00)
Glucose, Bld: 113 mg/dL — ABNORMAL HIGH (ref 70–99)
Potassium: 3.8 mEq/L (ref 3.5–5.1)
Sodium: 139 mEq/L (ref 135–145)
Total Bilirubin: 0.5 mg/dL (ref 0.2–1.2)
Total Protein: 7.6 g/dL (ref 6.0–8.3)

## 2018-10-14 LAB — GAMMA GT: GGT: 19 U/L (ref 7–51)

## 2018-10-14 LAB — HEMOGLOBIN A1C: Hgb A1c MFr Bld: 5.7 % (ref 4.6–6.5)

## 2018-11-02 ENCOUNTER — Ambulatory Visit (HOSPITAL_BASED_OUTPATIENT_CLINIC_OR_DEPARTMENT_OTHER)
Admission: RE | Admit: 2018-11-02 | Discharge: 2018-11-02 | Disposition: A | Discharge status: Home / Self Care | Payer: Commercial Managed Care - PPO | Source: Ambulatory Visit | Attending: Medical | Admitting: Medical

## 2018-11-02 ENCOUNTER — Telehealth: Payer: Self-pay | Admitting: Family Medicine

## 2018-11-02 DIAGNOSIS — F17200 Nicotine dependence, unspecified, uncomplicated: Secondary | ICD-10-CM | POA: Diagnosis present

## 2018-11-02 DIAGNOSIS — R05 Cough: Secondary | ICD-10-CM | POA: Diagnosis not present

## 2018-11-02 NOTE — Telephone Encounter (Signed)
Pt pcp is Dr. Patsy Lageropland I would recommend he make appointment with her in 3 months she needs to be aware of what is going on and she can discuss lab results with him.  I will go ahead and put in future labs.

## 2018-11-02 NOTE — Telephone Encounter (Signed)
Pt called for lab results from Dec 4 th.  Results given to him with verbal understanding. He does not want to take Wellbutrin or atorvastatin. He wants to change his diet, decrease his alcohol intake and work on exercising.  Appointment scheduled for his 3 months fasting lab check. Need order for fasting lipids and blood sugar. Pt was seen by E. Saguier, PA.

## 2019-01-10 ENCOUNTER — Other Ambulatory Visit (INDEPENDENT_AMBULATORY_CARE_PROVIDER_SITE_OTHER): Payer: Commercial Managed Care - PPO

## 2019-01-10 DIAGNOSIS — E785 Hyperlipidemia, unspecified: Secondary | ICD-10-CM

## 2019-01-10 DIAGNOSIS — R739 Hyperglycemia, unspecified: Secondary | ICD-10-CM

## 2019-01-11 ENCOUNTER — Encounter: Payer: Self-pay | Admitting: Family Medicine

## 2019-01-11 LAB — COMPREHENSIVE METABOLIC PANEL
ALT: 14 U/L (ref 0–53)
AST: 19 U/L (ref 0–37)
Albumin: 4.6 g/dL (ref 3.5–5.2)
Alkaline Phosphatase: 47 U/L (ref 39–117)
BILIRUBIN TOTAL: 0.2 mg/dL (ref 0.2–1.2)
BUN: 15 mg/dL (ref 6–23)
CHLORIDE: 107 meq/L (ref 96–112)
CO2: 25 meq/L (ref 19–32)
Calcium: 9.2 mg/dL (ref 8.4–10.5)
Creatinine, Ser: 1.15 mg/dL (ref 0.40–1.50)
GFR: 81.4 mL/min (ref >60.00)
GLUCOSE: 100 mg/dL — AB (ref 70–99)
POTASSIUM: 4.1 meq/L (ref 3.5–5.1)
Sodium: 143 mEq/L (ref 135–145)
Total Protein: 7 g/dL (ref 6.0–8.3)

## 2019-01-11 LAB — LIPID PANEL
Cholesterol: 203 mg/dL — ABNORMAL HIGH (ref 0–200)
HDL: 75.4 mg/dL (ref >39.00)
LDL Cholesterol: 113 mg/dL — ABNORMAL HIGH (ref 0–99)
NonHDL: 127.1
Total CHOL/HDL Ratio: 3
Triglycerides: 70 mg/dL (ref 0.0–149.0)
VLDL: 14 mg/dL (ref 0.0–40.0)

## 2019-01-11 LAB — HEMOGLOBIN A1C: Hgb A1c MFr Bld: 5.6 % (ref 4.6–6.5)

## 2019-10-27 ENCOUNTER — Other Ambulatory Visit: Payer: Self-pay

## 2019-10-27 DIAGNOSIS — Z20822 Contact with and (suspected) exposure to covid-19: Secondary | ICD-10-CM

## 2019-10-28 ENCOUNTER — Other Ambulatory Visit: Payer: Self-pay

## 2019-10-28 LAB — NOVEL CORONAVIRUS, NAA: SARS-CoV-2, NAA: NOT DETECTED

## 2023-01-23 ENCOUNTER — Ambulatory Visit: Payer: No Typology Code available for payment source | Admitting: Medical

## 2023-01-23 ENCOUNTER — Ambulatory Visit: Payer: Commercial Managed Care - PPO | Admitting: Medical

## 2023-01-23 VITALS — BP 104/60 | HR 100 | Temp 98.0°F | Resp 18 | Ht 71.0 in | Wt 170.0 lb

## 2023-01-23 DIAGNOSIS — G629 Polyneuropathy, unspecified: Secondary | ICD-10-CM | POA: Diagnosis not present

## 2023-01-23 DIAGNOSIS — Z0000 Encounter for general adult medical examination without abnormal findings: Secondary | ICD-10-CM | POA: Diagnosis not present

## 2023-01-23 DIAGNOSIS — R0781 Pleurodynia: Secondary | ICD-10-CM

## 2023-01-23 DIAGNOSIS — F1010 Alcohol abuse, uncomplicated: Secondary | ICD-10-CM

## 2023-01-23 DIAGNOSIS — Z1211 Encounter for screening for malignant neoplasm of colon: Secondary | ICD-10-CM | POA: Diagnosis not present

## 2023-01-23 DIAGNOSIS — Z125 Encounter for screening for malignant neoplasm of prostate: Secondary | ICD-10-CM

## 2023-01-23 DIAGNOSIS — F17200 Nicotine dependence, unspecified, uncomplicated: Secondary | ICD-10-CM

## 2023-01-23 DIAGNOSIS — R059 Cough, unspecified: Secondary | ICD-10-CM

## 2023-01-23 NOTE — Patient Instructions (Addendum)
For you wellness exam today I have ordered cbc, cmp, psa and lipid panel future fasting labs.  Undecided on shingrix vaccine. Declines Td.   Recommend exercise and healthy diet.  We will let you know lab results as they come in.  Follow up date appointment will be determined after lab review.     1. Screening for colon cancer Please call gi office schedule - Ambulatory referral to Gastroenterology  2. Neuropathy. No neck pain.  If b12 level normal refer to sport med MD.  - B12; Future - Vitamin B1; Future  3. Rib pain on left side  - DG Ribs Unilateral W/Chest Left; Future  4. Cough, unspecified type With hx of smoking and cough will follow xay. - DG Ribs Unilateral W/Chest Left; Future  5. Smoker Recommend smoking cessation. May in future refer to pulmonlogist if lung symptoms. Also may refer for screening CT chest. - DG Ribs Unilateral W/Chest Left; Future  6. Alcohol abuse Recommend start to cut back on alcohol use.  Follow up in 3 weeks or sooner if needed.

## 2023-01-23 NOTE — Progress Notes (Signed)
Subjective:    Patient ID: Carlos Wade, male    DOB: [birth year], 52 y.o.   MRN: [id number]  HPI  Pt here to get established. He is not fasting.  Pt work at [employer]. He is pretty busy at work. He works out 3-4 days a week. Tries to eat healthy. No caffeine. He drinks 3 beers a day weedays and 12 beers a day on week ends. Married- 2  adult children.   Hx of asthma- last flare years ago   Hx of high cholesterol as well. He is not fasting.   Smoker- about half a pack a day for 30 years on and off.   Pt like to ride motorcycles. Plays pool and bowls.   Elevated sugar.   Pt knows past due for colonoscopy.   Pt not sure about shingrix vaccine. Declines Td today.   Pt has left 4th and 5th digit tingling sensation. Pt had this for 4 months. No trauma or injury. Nurse at work told him ulnar neuropathy. Pt is left handed.   Also reports some mild left lower rib area pain if breaths deep or twists.     Review of Systems  Constitutional:  Negative for chills, fatigue and fever.  HENT:  Negative for congestion and drooling.   Respiratory:  Negative for cough, chest tightness, shortness of breath and wheezing.   Cardiovascular:  Negative for chest pain and palpitations.  Gastrointestinal:  Negative for abdominal pain, blood in stool, constipation and diarrhea.  Genitourinary:  Negative for dysuria and frequency.  Musculoskeletal:  Negative for back pain and gait problem.       Rib region pain.  Skin:  Negative for rash.  Neurological:  Negative for dizziness, syncope, numbness and headaches.       Decreased sensation left 4th and 5th rib.  Hematological:  Negative for adenopathy. Does not bruise/bleed easily.  Psychiatric/Behavioral:  Negative for behavioral problems, decreased concentration and dysphoric mood. The patient is not nervous/anxious.    Past Medical History:  Diagnosis Date   Asthma    Hyperlipidemia      Social History   Socioeconomic History   Marital  status: Married    Spouse name: Not on file   Number of children: Not on file   Years of education: Not on file   Highest education level: Not on file  Occupational History   Not on file  Tobacco Use   Smoking status: Every Day    Packs/day: 0.50    Years: 25.00    Additional pack years: 0.00    Total pack years: 12.50    Types: Cigarettes   Smokeless tobacco: Never  Substance and Sexual Activity   Alcohol use: Yes    Alcohol/week: 0.0 standard drinks of alcohol    Comment: 6 pack a day for 20 years.   Drug use: No   Sexual activity: Yes  Other Topics Concern   Not on file  Social History Narrative   Not on file   Social Determinants of Health   Financial Resource Strain: Not on file  Food Insecurity: Not on file  Transportation Needs: Not on file  Physical Activity: Not on file  Stress: Not on file  Social Connections: Not on file  Intimate Partner Violence: Not on file    No past surgical history on file.  Family History  Problem Relation Age of Onset   Asthma Father     No Known Allergies  No current outpatient medications on file prior  to visit.   No current facility-administered medications on file prior to visit.    BP 104/60   Pulse 100   Temp 98 F (36.7 C)   Resp 18   Ht 5\' 11"  (1.803 m)   Wt 170 lb (77.1 kg)   SpO2 97%   BMI 23.71 kg/m         Objective:   Physical Exam  General Mental Status- Alert. General Appearance- Not in acute distress.   Skin General: Color- Normal Color. Moisture- Normal Moisture.  Neck Carotid Arteries- Normal color. Moisture- Normal Moisture. No carotid bruits. No JVD.  Chest and Lung Exam Auscultation: Breath Sounds:-Normal.  Cardiovascular Auscultation:Rythm- Regular. Murmurs & Other Heart Sounds:Auscultation of the heart reveals- No Murmurs.  Abdomen Inspection:-Inspeection Normal. Palpation/Percussion:Note:No mass. Palpation and Percussion of the abdomen reveal- Non Tender, Non Distended +  BS, no rebound or guarding.    Neurologic Cranial Nerve exam:- CN III-XII intact(No nystagmus), symmetric smile. Drift Test:- No drift. Romberg Exam:- Negative.  Heal to Toe Gait exam:-Normal. Finger to Nose:- Normal/Intact Strength:- 5/5 equal and symmetric strength both upper and lower extremities.  Left hand- normal sensation and range of motion.  Left side ribs- nontender presetnly. No rash. No vesicles.  Back- no cva tendernss.    Assessment & Plan:   Patient Instructions  For you wellness exam today I have ordered cbc, cmp, psa and lipid panel future fasting labs.  Undecided on shingrix vaccine. Declines Td.   Recommend exercise and healthy diet.  We will let you know lab results as they come in.  Follow up date appointment will be determined after lab review.     1. Screening for colon cancer Please call gi office schedule - Ambulatory referral to Gastroenterology  2. Neuropathy. No neck pain.  If b12 level normal refer to sport med MD.  - B12; Future - Vitamin B1; Future  3. Rib pain on left side  - DG Ribs Unilateral W/Chest Left; Future  4. Cough, unspecified type With hx of smoking and cough will follow xay. - DG Ribs Unilateral W/Chest Left; Future  5. Smoker Recommend smoking cessation. May in future refer to pulmonlogist if lung symptoms. Also may refer for screening CT chest. - DG Ribs Unilateral W/Chest Left; Future  6. Alcohol abuse Recommend start to cut back on alcohol use.         Mackie Pai, PA-C

## 2023-01-30 ENCOUNTER — Ambulatory Visit (HOSPITAL_BASED_OUTPATIENT_CLINIC_OR_DEPARTMENT_OTHER)
Admission: RE | Admit: 2023-01-30 | Discharge: 2023-01-30 | Disposition: A | Discharge status: Home / Self Care | Payer: No Typology Code available for payment source | Source: Ambulatory Visit | Attending: Medical | Admitting: Medical

## 2023-01-30 ENCOUNTER — Other Ambulatory Visit (INDEPENDENT_AMBULATORY_CARE_PROVIDER_SITE_OTHER): Payer: No Typology Code available for payment source

## 2023-01-30 DIAGNOSIS — G629 Polyneuropathy, unspecified: Secondary | ICD-10-CM

## 2023-01-30 DIAGNOSIS — R059 Cough, unspecified: Secondary | ICD-10-CM | POA: Insufficient documentation

## 2023-01-30 DIAGNOSIS — R0781 Pleurodynia: Secondary | ICD-10-CM | POA: Insufficient documentation

## 2023-01-30 DIAGNOSIS — Z0000 Encounter for general adult medical examination without abnormal findings: Secondary | ICD-10-CM | POA: Diagnosis not present

## 2023-01-30 DIAGNOSIS — Z125 Encounter for screening for malignant neoplasm of prostate: Secondary | ICD-10-CM

## 2023-01-30 DIAGNOSIS — F17200 Nicotine dependence, unspecified, uncomplicated: Secondary | ICD-10-CM | POA: Diagnosis present

## 2023-01-30 LAB — CBC WITH DIFFERENTIAL/PLATELET
Basophils Absolute: 0 10*3/uL (ref 0.0–0.1)
Basophils Relative: 1.1 % (ref 0.0–3.0)
Eosinophils Absolute: 0.2 10*3/uL (ref 0.0–0.7)
Eosinophils Relative: 5.2 % — ABNORMAL HIGH (ref 0.0–5.0)
HCT: 41.3 % (ref 39.0–52.0)
Hemoglobin: 13.5 g/dL (ref 13.0–17.0)
Lymphocytes Relative: 42.1 % (ref 12.0–46.0)
Lymphs Abs: 1.6 10*3/uL (ref 0.7–4.0)
MCHC: 32.7 g/dL (ref 30.0–36.0)
MCV: 97.6 fl (ref 78.0–100.0)
Monocytes Absolute: 0.4 10*3/uL (ref 0.1–1.0)
Monocytes Relative: 11.9 % (ref 3.0–12.0)
Neutro Abs: 1.5 10*3/uL (ref 1.4–7.7)
Neutrophils Relative %: 39.7 % — ABNORMAL LOW (ref 43.0–77.0)
Platelets: 252 10*3/uL (ref 150.0–400.0)
RBC: 4.24 Mil/uL (ref 4.22–5.81)
RDW: 13.7 % (ref 11.5–15.5)
WBC: 3.7 10*3/uL — ABNORMAL LOW (ref 4.0–10.5)

## 2023-01-30 LAB — COMPREHENSIVE METABOLIC PANEL
ALT: 12 U/L (ref 0–53)
AST: 19 U/L (ref 0–37)
Albumin: 4.6 g/dL (ref 3.5–5.2)
Alkaline Phosphatase: 50 U/L (ref 39–117)
BUN: 8 mg/dL (ref 6–23)
CO2: 31 mEq/L (ref 19–32)
Calcium: 9.6 mg/dL (ref 8.4–10.5)
Chloride: 103 mEq/L (ref 96–112)
Creatinine, Ser: 1.13 mg/dL (ref 0.40–1.50)
GFR: 73.85 mL/min (ref >60.00)
Glucose, Bld: 88 mg/dL (ref 70–99)
Potassium: 4.4 mEq/L (ref 3.5–5.1)
Sodium: 141 mEq/L (ref 135–145)
Total Bilirubin: 0.5 mg/dL (ref 0.2–1.2)
Total Protein: 7.4 g/dL (ref 6.0–8.3)

## 2023-01-30 LAB — PSA: PSA: 1.64 ng/mL (ref 0.10–4.00)

## 2023-01-30 LAB — LIPID PANEL
Cholesterol: 224 mg/dL — ABNORMAL HIGH (ref 0–200)
HDL: 66.8 mg/dL (ref >39.00)
LDL Cholesterol: 119 mg/dL — ABNORMAL HIGH (ref 0–99)
NonHDL: 156.91
Total CHOL/HDL Ratio: 3
Triglycerides: 190 mg/dL — ABNORMAL HIGH (ref 0.0–149.0)
VLDL: 38 mg/dL (ref 0.0–40.0)

## 2023-01-30 LAB — VITAMIN B12: Vitamin B-12: 220 pg/mL (ref 211–911)

## 2023-01-31 ENCOUNTER — Encounter: Payer: Self-pay | Admitting: Medical

## 2023-02-02 LAB — VITAMIN B1: Vitamin B1 (Thiamine): <6 nmol/L — ABNORMAL LOW (ref 8–30)

## 2023-06-26 ENCOUNTER — Ambulatory Visit: Payer: No Typology Code available for payment source | Admitting: Physician Assistant

## 2023-08-14 ENCOUNTER — Other Ambulatory Visit: Payer: Self-pay | Admitting: Medical

## 2023-08-14 DIAGNOSIS — Z1211 Encounter for screening for malignant neoplasm of colon: Secondary | ICD-10-CM

## 2023-08-14 DIAGNOSIS — Z1212 Encounter for screening for malignant neoplasm of rectum: Secondary | ICD-10-CM
